# Patient Record
Sex: Male | Born: 1995
Health system: Southern US, Community
[De-identification: ages and names within clinical notes are randomized; demographics above are authoritative.]

---

## 2013-09-23 ENCOUNTER — Emergency Department (HOSPITAL_COMMUNITY)
Admission: EM | Admit: 2013-09-23 | Discharge: 2013-09-23 | Disposition: A | Discharge status: Home / Self Care | Payer: BC Managed Care – PPO | Attending: Emergency Medicine | Admitting: Emergency Medicine

## 2013-09-23 ENCOUNTER — Encounter (HOSPITAL_COMMUNITY): Payer: Self-pay | Admitting: Emergency Medicine

## 2013-09-23 DIAGNOSIS — Y92410 Unspecified street and highway as the place of occurrence of the external cause: Secondary | ICD-10-CM | POA: Insufficient documentation

## 2013-09-23 DIAGNOSIS — S199XXA Unspecified injury of neck, initial encounter: Secondary | ICD-10-CM | POA: Diagnosis present

## 2013-09-23 DIAGNOSIS — S161XXA Strain of muscle, fascia and tendon at neck level, initial encounter: Secondary | ICD-10-CM

## 2013-09-23 DIAGNOSIS — S139XXA Sprain of joints and ligaments of unspecified parts of neck, initial encounter: Secondary | ICD-10-CM | POA: Diagnosis not present

## 2013-09-23 DIAGNOSIS — Y9389 Activity, other specified: Secondary | ICD-10-CM | POA: Insufficient documentation

## 2013-09-23 DIAGNOSIS — S0993XA Unspecified injury of face, initial encounter: Secondary | ICD-10-CM | POA: Diagnosis present

## 2013-09-23 MED ORDER — NAPROXEN 500 MG PO TABS: 500.0000 mg | ORAL_TABLET | Freq: Two times a day (BID) | ORAL | Status: DC

## 2013-09-23 MED ORDER — METHOCARBAMOL 500 MG PO TABS: 500.0000 mg | ORAL_TABLET | Freq: Two times a day (BID) | ORAL | Status: DC

## 2013-09-23 MED ORDER — METHOCARBAMOL 500 MG PO TABS
500.0000 mg | ORAL_TABLET | Freq: Once | ORAL | Status: AC
  Administered 2013-09-23: 500 mg via ORAL
  Filled 2013-09-23: qty 1

## 2013-09-23 MED ORDER — HYDROCODONE-ACETAMINOPHEN 5-325 MG PO TABS: 1.0000 | ORAL_TABLET | ORAL | Status: DC | PRN

## 2013-09-23 MED ORDER — IBUPROFEN 800 MG PO TABS
800.0000 mg | ORAL_TABLET | Freq: Once | ORAL | Status: AC
  Administered 2013-09-23: 800 mg via ORAL
  Filled 2013-09-23: qty 1
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
## 2013-09-23 NOTE — ED Provider Notes (Signed)
Medical screening examination/treatment/procedure(s) were performed by non-physician practitioner and as supervising physician I was immediately available for consultation/collaboration.    Jaquarius Seder M Noely Kuhnle, MD 09/23/13 0629 

## 2013-09-23 NOTE — Discharge Instructions (Signed)
1. Medications: robaxin, naproxyn, vicodin, usual home medications 2. Treatment: rest, drink plenty of fluids, gentle stretching as discussed, alternate ice and heat 3. Follow Up: Please followup with your primary doctor for discussion of your diagnoses and further evaluation after today's visit; if you do not have a primary care doctor use the resource guide provided to find one;   Cervical Sprain A cervical sprain is an injury in the neck in which the strong, fibrous tissues (ligaments) that connect your neck bones stretch or tear. Cervical sprains can range from mild to severe. Severe cervical sprains can cause the neck vertebrae to be unstable. This can lead to damage of the spinal cord and can result in serious nervous system problems. The amount of time it takes for a cervical sprain to get better depends on the cause and extent of the injury. Most cervical sprains heal in 1 to 3 weeks. CAUSES  Severe cervical sprains may be caused by:   Contact sport injuries (such as from football, rugby, wrestling, hockey, auto racing, gymnastics, diving, martial arts, or boxing).   Motor vehicle collisions.   Whiplash injuries. This is an injury from a sudden forward-and backward whipping movement of the head and neck.  Falls.  Mild cervical sprains may be caused by:   Being in an awkward position, such as while cradling a telephone between your ear and shoulder.   Sitting in a chair that does not offer proper support.   Working at a poorly Marketing executive station.   Looking up or down for long periods of time.  SYMPTOMS   Pain, soreness, stiffness, or a burning sensation in the front, back, or sides of the neck. This discomfort may develop immediately after the injury or slowly, 24 hours or more after the injury.   Pain or tenderness directly in the middle of the back of the neck.   Shoulder or upper back pain.   Limited ability to move the neck.   Headache.    Dizziness.   Weakness, numbness, or tingling in the hands or arms.   Muscle spasms.   Difficulty swallowing or chewing.   Tenderness and swelling of the neck.  DIAGNOSIS  Most of the time your health care provider can diagnose a cervical sprain by taking your history and doing a physical exam. Your health care provider will ask about previous neck injuries and any known neck problems, such as arthritis in the neck. X-rays may be taken to find out if there are any other problems, such as with the bones of the neck. Other tests, such as a CT scan or MRI, may also be needed.  TREATMENT  Treatment depends on the severity of the cervical sprain. Mild sprains can be treated with rest, keeping the neck in place (immobilization), and pain medicines. Severe cervical sprains are immediately immobilized. Further treatment is done to help with pain, muscle spasms, and other symptoms and may include:  Medicines, such as pain relievers, numbing medicines, or muscle relaxants.   Physical therapy. This may involve stretching exercises, strengthening exercises, and posture training. Exercises and improved posture can help stabilize the neck, strengthen muscles, and help stop symptoms from returning.  HOME CARE INSTRUCTIONS   Put ice on the injured area.   Put ice in a plastic bag.   Place a towel between your skin and the bag.   Leave the ice on for 15 20 minutes, 3 4 times a day.   If your injury was severe, you may have  been given a cervical collar to wear. A cervical collar is a two-piece collar designed to keep your neck from moving while it heals.  Do not remove the collar unless instructed by your health care provider.  If you have long hair, keep it outside of the collar.  Ask your health care provider before making any adjustments to your collar. Minor adjustments may be required over time to improve comfort and reduce pressure on your chin or on the back of your  head.  Ifyou are allowed to remove the collar for cleaning or bathing, follow your health care provider's instructions on how to do so safely.  Keep your collar clean by wiping it with mild soap and water and drying it completely. If the collar you have been given includes removable pads, remove them every 1 2 days and hand wash them with soap and water. Allow them to air dry. They should be completely dry before you wear them in the collar.  If you are allowed to remove the collar for cleaning and bathing, wash and dry the skin of your neck. Check your skin for irritation or sores. If you see any, tell your health care provider.  Do not drive while wearing the collar.   Only take over-the-counter or prescription medicines for pain, discomfort, or fever as directed by your health care provider.   Keep all follow-up appointments as directed by your health care provider.   Keep all physical therapy appointments as directed by your health care provider.   Make any needed adjustments to your workstation to promote good posture.   Avoid positions and activities that make your symptoms worse.   Warm up and stretch before being active to help prevent problems.  SEEK MEDICAL CARE IF:   Your pain is not controlled with medicine.   You are unable to decrease your pain medicine over time as planned.   Your activity level is not improving as expected.  SEEK IMMEDIATE MEDICAL CARE IF:   You develop any bleeding.  You develop stomach upset.  You have signs of an allergic reaction to your medicine.   Your symptoms get worse.   You develop new, unexplained symptoms.   You have numbness, tingling, weakness, or paralysis in any part of your body.  MAKE SURE YOU:   Understand these instructions.  Will watch your condition.  Will get help right away if you are not doing well or get worse. Document Released: 05/04/2007 Document Revised: 04/27/2013 Document Reviewed:  01/12/2013 [facility] Patient Information 2014 [place], [place].
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
## 2017-01-14 DIAGNOSIS — M25512 Pain in left shoulder: Secondary | ICD-10-CM | POA: Diagnosis not present

## 2017-01-14 DIAGNOSIS — Z6823 Body mass index (BMI) 23.0-23.9, adult: Secondary | ICD-10-CM | POA: Diagnosis not present

## 2017-01-17 ENCOUNTER — Emergency Department (HOSPITAL_COMMUNITY)
Admission: EM | Admit: 2017-01-17 | Discharge: 2017-01-17 | Disposition: A | Discharge status: Home / Self Care | Payer: BLUE CROSS/BLUE SHIELD | Attending: Emergency Medicine | Admitting: Emergency Medicine

## 2017-01-17 ENCOUNTER — Emergency Department (HOSPITAL_COMMUNITY): Payer: BLUE CROSS/BLUE SHIELD

## 2017-01-17 ENCOUNTER — Encounter (HOSPITAL_COMMUNITY): Payer: Self-pay | Admitting: Nurse Practitioner

## 2017-01-17 DIAGNOSIS — S01112A Laceration without foreign body of left eyelid and periocular area, initial encounter: Secondary | ICD-10-CM | POA: Insufficient documentation

## 2017-01-17 DIAGNOSIS — F10920 Alcohol use, unspecified with intoxication, uncomplicated: Secondary | ICD-10-CM

## 2017-01-17 DIAGNOSIS — Y999 Unspecified external cause status: Secondary | ICD-10-CM | POA: Insufficient documentation

## 2017-01-17 DIAGNOSIS — F10120 Alcohol abuse with intoxication, uncomplicated: Secondary | ICD-10-CM | POA: Insufficient documentation

## 2017-01-17 DIAGNOSIS — S0990XA Unspecified injury of head, initial encounter: Secondary | ICD-10-CM | POA: Insufficient documentation

## 2017-01-17 DIAGNOSIS — Z23 Encounter for immunization: Secondary | ICD-10-CM | POA: Insufficient documentation

## 2017-01-17 DIAGNOSIS — Y9389 Activity, other specified: Secondary | ICD-10-CM | POA: Diagnosis not present

## 2017-01-17 DIAGNOSIS — Y9289 Other specified places as the place of occurrence of the external cause: Secondary | ICD-10-CM | POA: Diagnosis not present

## 2017-01-17 DIAGNOSIS — S01311A Laceration without foreign body of right ear, initial encounter: Secondary | ICD-10-CM | POA: Diagnosis not present

## 2017-01-17 DIAGNOSIS — Z79899 Other long term (current) drug therapy: Secondary | ICD-10-CM | POA: Insufficient documentation

## 2017-01-17 MED ORDER — FLUORESCEIN SODIUM 0.6 MG OP STRP
1.0000 | ORAL_STRIP | Freq: Once | OPHTHALMIC | Status: AC
  Administered 2017-01-17: 1 via OPHTHALMIC
  Filled 2017-01-17: qty 1

## 2017-01-17 MED ORDER — LIDOCAINE-EPINEPHRINE (PF) 2 %-1:200000 IJ SOLN
20.0000 mL | Freq: Once | INTRAMUSCULAR | Status: AC
  Administered 2017-01-17: 20 mL
  Filled 2017-01-17: qty 20

## 2017-01-17 MED ORDER — TETRACAINE HCL 0.5 % OP SOLN
2.0000 [drp] | Freq: Once | OPHTHALMIC | Status: AC
  Administered 2017-01-17: 2 [drp] via OPHTHALMIC
  Filled 2017-01-17: qty 4

## 2017-01-17 MED ORDER — TETANUS-DIPHTH-ACELL PERTUSSIS 5-2.5-18.5 LF-MCG/0.5 IM SUSP
0.5000 mL | Freq: Once | INTRAMUSCULAR | Status: AC
  Administered 2017-01-17: 0.5 mL via INTRAMUSCULAR
  Filled 2017-01-17: qty 0.5

## 2017-01-17 NOTE — ED Notes (Signed)
Patient transported to CT 

## 2017-01-17 NOTE — Discharge Instructions (Signed)
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
## 2017-01-25 ENCOUNTER — Ambulatory Visit (HOSPITAL_COMMUNITY)
Admission: EM | Admit: 2017-01-25 | Discharge: 2017-01-25 | Disposition: A | Discharge status: Home / Self Care | Payer: BLUE CROSS/BLUE SHIELD | Attending: Internal Medicine | Admitting: Internal Medicine

## 2017-01-25 ENCOUNTER — Encounter (HOSPITAL_COMMUNITY): Payer: Self-pay | Admitting: Emergency Medicine

## 2017-01-25 DIAGNOSIS — Z4802 Encounter for removal of sutures: Secondary | ICD-10-CM

## 2017-01-25 NOTE — ED Triage Notes (Signed)
[facility] 7 sutures [facility] from his left eyebrow that were placed 8 days ago.

## 2017-01-25 NOTE — Discharge Instructions (Signed)
Recheck for any increasing redness/swelling/pain/drainage or new fever >100.5.  Continue good wound care with washing 1-2x daily with soap/water and applying antibiotic ointment, until scabs resolve.

## 2017-01-25 NOTE — ED Provider Notes (Signed)
  [facility]    CSN: [id number] Arrival date & time: 01/25/17  1858     History   Chief Complaint Chief Complaint  Patient presents with  . Suture / Staple Removal    HPI Marc Parker is a 21 y.o. male. Here for removal of 7 sutures at the left eyebrow. No pain/drainage, no redness. Feels fine.  HPI  History reviewed. No pertinent past medical history.  History reviewed. No pertinent surgical history.     Home Medications   Takes no meds regularly  Family History History reviewed. No pertinent family history.  Social History Social History  Substance Use Topics  . Smoking status: Never Smoker  . Smokeless tobacco: Current User  . Alcohol use Yes     Allergies   Patient has no known allergies.   Review of Systems Review of Systems  All other systems reviewed and are negative.    Physical Exam Triage Vital Signs ED Triage Vitals  Enc Vitals Group     BP 01/25/17 1921 (!) 105/58     Pulse Rate 01/25/17 1921 72     Resp 01/25/17 1921 18     Temp 01/25/17 1921 98.7 F (37.1 C)     Temp Source 01/25/17 1921 Oral     SpO2 01/25/17 1921 99 %     Weight --      Height --      Pain Score 01/25/17 1920 0   Updated Vital Signs BP (!) 105/58 (BP Location: Right Arm)   Pulse 72   Temp 98.7 F (37.1 C) (Oral)   Resp 18   SpO2 99%   Physical Exam  Constitutional: He is oriented to person, place, and time. No distress.  Alert, nicely groomed  HENT:  Head: Atraumatic.  Eyes:  Conjugate gaze, no eye redness/drainage  Neck: Neck supple.  Cardiovascular: Normal rate.   Pulmonary/Chest: No respiratory distress.  Abdominal: He exhibits no distension.  Musculoskeletal: Normal range of motion.  Neurological: He is alert and oriented to person, place, and time.  Skin: Skin is warm and dry.  No cyanosis 7 sutures in the left eyebrow area. Wound edges are well approximated, and healed. There is no erythema/swelling/drainage, the area is  nontender. 7 sutures were removed without incident.  Nursing note and vitals reviewed.    UC Treatments / Results   Procedures Procedures (including critical care time) #7 sutures removed from left eyebrow    Final Clinical Impressions(s) / UC Diagnoses   Final diagnoses:  Visit for suture removal   Recheck as needed.     Eustace MooreMurray, Laura W, MD 01/28/17 2233

## 2017-06-20 ENCOUNTER — Ambulatory Visit (HOSPITAL_COMMUNITY)
Admission: EM | Admit: 2017-06-20 | Discharge: 2017-06-20 | Disposition: A | Discharge status: Home / Self Care | Payer: BLUE CROSS/BLUE SHIELD | Attending: Family Medicine | Admitting: Family Medicine

## 2017-06-20 ENCOUNTER — Encounter (HOSPITAL_COMMUNITY): Payer: Self-pay | Admitting: Family Medicine

## 2017-06-20 DIAGNOSIS — J029 Acute pharyngitis, unspecified: Secondary | ICD-10-CM | POA: Diagnosis not present

## 2017-06-20 LAB — POCT RAPID STREP A: Streptococcus, Group A Screen (Direct): NEGATIVE

## 2017-06-20 MED ORDER — AMOXICILLIN 875 MG PO TABS: 875.0000 mg | ORAL_TABLET | Freq: Two times a day (BID) | ORAL | 0 refills | Status: DC

## 2017-06-20 NOTE — Discharge Instructions (Signed)
The initial throat test was negative, so we will running a more comprehensive test which should be completed Monday.  We will let you know the results when they come back.  In the meantime I want you to start the antibiotic because of the severity of this.

## 2017-06-20 NOTE — ED Provider Notes (Signed)
  [facility]   [id number] 06/20/17 Arrival Time: 1450   SUBJECTIVE:  Marc Parker is a 21 y.o. male who presents to the urgent care with complaint of sore throat x 2 days. Reports drainage and runny nose.   No ear pain or cough  Works for [employer] as Production designer, theatre/television/filmmanager   History reviewed. No pertinent past medical history. History reviewed. No pertinent family history. Social History   Socioeconomic History  . Marital status: Single    Spouse name: Not on file  . Number of children: Not on file  . Years of education: Not on file  . Highest education level: Not on file  Social Needs  . Financial resource strain: Not on file  . Food insecurity - worry: Not on file  . Food insecurity - inability: Not on file  . Transportation needs - medical: Not on file  . Transportation needs - non-medical: Not on file  Occupational History  . Not on file  Tobacco Use  . Smoking status: Never Smoker  . Smokeless tobacco: Current User  Substance and Sexual Activity  . Alcohol use: Yes  . Drug use: No  . Sexual activity: Not on file  Other Topics Concern  . Not on file  Social History Narrative  . Not on file   No outpatient medications have been marked as taking for the 06/20/17 encounter [facility](Hospital Encounter).   No Known Allergies    ROS: As per HPI, remainder of ROS negative.   OBJECTIVE:   Vitals:   06/20/17 1538  BP: 102/63  Pulse: 79  Resp: 18  Temp: 98.5 F (36.9 C)  SpO2: 100%     General appearance: alert; no distress Eyes: PERRL; EOMI; conjunctiva normal HENT: normocephalic; atraumatic; external ears normal without trauma; nasal mucosa normal; oral mucosa very red and edematous posterior pharynx Neck: supple Lungs: clear to auscultation bilaterally Heart: regular rate and rhythm Back: no CVA tenderness Extremities: no cyanosis or edema; symmetrical with no gross deformities Skin: warm and dry Neurologic: normal gait; grossly normal Psychological: alert and  cooperative; normal mood and affect      Labs:  Results for orders placed or performed during the hospital encounter of 06/20/17  POCT rapid strep A [facility])  Result Value Ref Range   Streptococcus, Group A Screen (Direct) NEGATIVE NEGATIVE    Labs Reviewed  CULTURE, GROUP A STREP [facility])  POCT RAPID STREP A    No results found.     ASSESSMENT & PLAN:  1. Sore throat     Meds ordered this encounter  Medications  . amoxicillin (AMOXIL) 875 MG tablet    Sig: Take 1 tablet (875 mg total) by mouth 2 (two) times daily.    Dispense:  20 tablet    Refill:  0    Reviewed expectations re: course of current medical issues. Questions answered. Outlined signs and symptoms indicating need for more acute intervention. Patient verbalized understanding. After Visit Summary given.    Procedures:      Elvina SidleLauenstein, Benoit Meech, MD 06/20/17 [phone]

## 2017-06-20 NOTE — ED Triage Notes (Signed)
Pt here for sore throat x 2 days. Reports drainage and runny nose.

## 2017-06-23 LAB — CULTURE, GROUP A STREP (THRC)

## 2018-02-06 ENCOUNTER — Ambulatory Visit (HOSPITAL_COMMUNITY)
Admission: EM | Admit: 2018-02-06 | Discharge: 2018-02-06 | Disposition: A | Discharge status: Home / Self Care | Payer: BLUE CROSS/BLUE SHIELD | Attending: Family Medicine | Admitting: Family Medicine

## 2018-02-06 ENCOUNTER — Encounter (HOSPITAL_COMMUNITY): Payer: Self-pay

## 2018-02-06 ENCOUNTER — Other Ambulatory Visit: Payer: Self-pay

## 2018-02-06 DIAGNOSIS — J019 Acute sinusitis, unspecified: Secondary | ICD-10-CM

## 2018-02-06 MED ORDER — MONTELUKAST SODIUM 10 MG PO TABS: 10.0000 mg | ORAL_TABLET | Freq: Every day | ORAL | 0 refills | Status: DC

## 2018-02-06 MED ORDER — AZITHROMYCIN 250 MG PO TABS: ORAL_TABLET | ORAL | 0 refills | Status: DC

## 2018-02-06 NOTE — ED Triage Notes (Signed)
[facility], pt complains of nasal drainage and cough. Pt has taken OTC medications but has no relief

## 2018-02-06 NOTE — ED Provider Notes (Signed)
[facility]    CSN: [id number] Arrival date & time: 02/06/18  1503     History   Chief Complaint Chief Complaint  Patient presents with  . Facial Pain    HPI Marc MinsBrandon Parker is a 22 y.o. male.   HPI Sinus infection: C/O runny nose, nasal congestion, greening nasal discharge, postnasal drip and cough ongoing for 1 week. He denies fever, no facial pain. He had been on Zyrtec in the past. Denies sick contact. Symptoms seems to be worsening.  History reviewed. No pertinent past medical history.  There are no active problems to display for this patient.   History reviewed. No pertinent surgical history.     Home Medications    Prior to Admission medications   Medication Sig Start Date End Date Taking? Authorizing Provider  amoxicillin (AMOXIL) 875 MG tablet Take 1 tablet (875 mg total) by mouth 2 (two) times daily. 06/20/17   Elvina SidleLauenstein, Kurt, MD    Family History History reviewed. No pertinent family history.  Social History Social History   Tobacco Use  . Smoking status: Never Smoker  . Smokeless tobacco: Current User  Substance Use Topics  . Alcohol use: Yes  . Drug use: No     Allergies   Patient has no known allergies.   Review of Systems Review of Systems  Constitutional: Negative.  Negative for fever.  HENT: Positive for congestion, postnasal drip, rhinorrhea, sneezing and voice change. Negative for facial swelling, sinus pressure, sinus pain and trouble swallowing.   Respiratory: Positive for cough. Negative for wheezing.   Cardiovascular: Negative.   Gastrointestinal: Negative.   Musculoskeletal: Negative.   All other systems reviewed and are negative.    Physical Exam Triage Vital Signs ED Triage Vitals  Enc Vitals Group     BP 02/06/18 1528 123/72     Pulse Rate 02/06/18 1528 72     Resp --      Temp 02/06/18 1528 98.5 F (36.9 C)     Temp Source 02/06/18 1528 Oral     SpO2 02/06/18 1528 99 %     Weight --      Height --        Head Circumference --      Peak Flow --      Pain Score 02/06/18 1529 0     Pain Loc --      Pain Edu? --      Excl. in GC? --    No data found.  Updated Vital Signs BP 123/72 (BP Location: Left Arm)   Pulse 72   Temp 98.5 F (36.9 C) (Oral)   SpO2 99%   Visual Acuity Right Eye Distance:   Left Eye Distance:   Bilateral Distance:    Right Eye Near:   Left Eye Near:    Bilateral Near:     Physical Exam  Constitutional: No distress.  HENT:  Right Ear: External ear and ear canal normal. No drainage or swelling. Tympanic membrane is not injected and not perforated. No middle ear effusion. No hemotympanum.  Left Ear: External ear and ear canal normal. No drainage or swelling. Tympanic membrane is injected. Tympanic membrane is not perforated.  No middle ear effusion. No hemotympanum.  Nose: Nose normal.  Mouth/Throat: Oropharynx is clear and moist. No oropharyngeal exudate.  Eyes: Pupils are equal, round, and reactive to light. EOM are normal. Right eye exhibits no discharge. Left eye exhibits no discharge.  Neck: Neck supple.  Cardiovascular: Normal rate, regular  rhythm and normal heart sounds.  No murmur heard. Pulmonary/Chest: Effort normal and breath sounds normal. No stridor. No respiratory distress. He has no wheezes.  Abdominal: Bowel sounds are normal. He exhibits no distension. There is no tenderness.     UC Treatments / Results  Labs (all labs ordered are listed, but only abnormal results are displayed) Labs Reviewed - No data to display  EKG None  Radiology No results found.  Procedures Procedures (including critical care time)  Medications Ordered in UC Medications - No data to display  Initial Impression / Assessment and Plan / UC Course  I have reviewed the triage vital signs and the nursing notes.  Pertinent labs & imaging results that were available during my care of the patient were reviewed by me and considered in my medical decision  making (see chart for details).  Clinical Course as of Feb 06 1609  Sat Feb 06, 2018  1607 Acute sinusitis. Likely allergic. No signs of infection. Starts Singulair. Nasal saline rinse recommended. May restart Zyrtec as well. ZPak prescribed to start only if symptoms persists or worsens. He agreed with the plan. F/U as needed.   [KE]    Clinical Course User Index [KE] Doreene Eland, MD    Acute non-recurrent sinusitis, unspecified location   Final Clinical Impressions(s) / UC Diagnoses   Final diagnoses:  None   Discharge Instructions   None    ED Prescriptions    None     Controlled Substance Prescriptions [place] Controlled Substance Registry consulted? Not Applicable   Doreene Eland, MD 02/06/18 [phone]

## 2018-02-06 NOTE — Discharge Instructions (Signed)
It was nice seeing you. I am sorry you don't feel well. You likely have allergic sinusitis. Please use Singuliar daily. May also restart Zyrtec. I prescribed antibiotic, only start if symptoms worsens or not getting better in about 5-7 days. See us soon if no improvement.

## 2018-03-03 ENCOUNTER — Other Ambulatory Visit: Payer: Self-pay | Admitting: Family Medicine

## 2018-03-29 ENCOUNTER — Other Ambulatory Visit: Payer: Self-pay | Admitting: Family Medicine

## 2018-04-18 ENCOUNTER — Encounter (HOSPITAL_COMMUNITY): Payer: Self-pay | Admitting: Emergency Medicine

## 2018-04-18 ENCOUNTER — Ambulatory Visit (HOSPITAL_COMMUNITY)
Admission: EM | Admit: 2018-04-18 | Discharge: 2018-04-18 | Disposition: A | Discharge status: Home / Self Care | Payer: BLUE CROSS/BLUE SHIELD | Attending: Urgent Care | Admitting: Urgent Care

## 2018-04-18 DIAGNOSIS — W57XXXA Bitten or stung by nonvenomous insect and other nonvenomous arthropods, initial encounter: Secondary | ICD-10-CM | POA: Diagnosis not present

## 2018-04-18 DIAGNOSIS — S40862A Insect bite (nonvenomous) of left upper arm, initial encounter: Secondary | ICD-10-CM | POA: Diagnosis not present

## 2018-04-18 DIAGNOSIS — L299 Pruritus, unspecified: Secondary | ICD-10-CM

## 2018-04-18 MED ORDER — CETIRIZINE HCL 10 MG PO TABS: 10.0000 mg | ORAL_TABLET | Freq: Every day | ORAL | 0 refills | Status: AC

## 2018-04-18 MED ORDER — RANITIDINE HCL 150 MG PO TABS: 150.0000 mg | ORAL_TABLET | Freq: Two times a day (BID) | ORAL | 0 refills | Status: AC

## 2018-04-18 MED ORDER — MONTELUKAST SODIUM 10 MG PO TABS: 10.0000 mg | ORAL_TABLET | Freq: Every day | ORAL | 0 refills | Status: AC

## 2018-04-18 MED ORDER — TRIAMCINOLONE ACETONIDE 0.1 % EX CREA: 1.0000 "application " | TOPICAL_CREAM | Freq: Two times a day (BID) | CUTANEOUS | 0 refills | Status: AC

## 2018-04-18 NOTE — ED Triage Notes (Signed)
Pt c/o spider bite on L upper arm he noticed on Friday, states the redness is spreading.

## 2018-04-18 NOTE — ED Provider Notes (Signed)
  MRN: [id number] DOB: [birth year]  Subjective:   Marc Parker is a 22 y.o. male presenting for a history of spider bite.  Has a red and itchy wound over medial portion of his left upper arm.  Denies fever, nausea, vomiting, drainage of pus or bleeding.  No current facility-administered medications for this encounter.   Current Outpatient Medications:  .  montelukast (SINGULAIR) 10 MG tablet, Take 1 tablet (10 mg total) by mouth at bedtime., Disp: 30 tablet, Rfl: 0   No Known Allergies  Has pmh of seasonal allergies. Denies past surgical history.   Objective:   Vitals: BP 129/73   Pulse 71   Temp 98.3 F (36.8 C)   Resp 16   SpO2 99%   Physical Exam  Constitutional: He is oriented to person, place, and time. He appears well-developed and well-nourished.  Cardiovascular: Normal rate.  Pulmonary/Chest: Effort normal.  Neurological: He is alert and oriented to person, place, and time.  Skin:       Assessment and Plan :   Insect bite of left upper arm, initial encounter  Itching  We will manage as a contact dermatitis with Zyrtec and Zantac, use triamcinolone topically. Return-to-clinic precautions discussed, patient verbalized understanding.      Wallis Bamberg, PA-C 04/18/18 1745

## 2018-04-30 DIAGNOSIS — Z6824 Body mass index (BMI) 24.0-24.9, adult: Secondary | ICD-10-CM | POA: Diagnosis not present

## 2018-04-30 DIAGNOSIS — R21 Rash and other nonspecific skin eruption: Secondary | ICD-10-CM | POA: Diagnosis not present
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
## 2020-03-06 DIAGNOSIS — R509 Fever, unspecified: Secondary | ICD-10-CM | POA: Diagnosis not present

## 2020-03-06 DIAGNOSIS — R070 Pain in throat: Secondary | ICD-10-CM | POA: Diagnosis not present

## 2020-03-20 DIAGNOSIS — M222X1 Patellofemoral disorders, right knee: Secondary | ICD-10-CM | POA: Diagnosis not present

## 2020-03-20 DIAGNOSIS — R1084 Generalized abdominal pain: Secondary | ICD-10-CM | POA: Diagnosis not present

## 2020-05-25 DIAGNOSIS — Z0000 Encounter for general adult medical examination without abnormal findings: Secondary | ICD-10-CM | POA: Diagnosis not present

## 2020-05-25 DIAGNOSIS — K219 Gastro-esophageal reflux disease without esophagitis: Secondary | ICD-10-CM | POA: Diagnosis not present

## 2020-06-26 DIAGNOSIS — M222X2 Patellofemoral disorders, left knee: Secondary | ICD-10-CM | POA: Diagnosis not present

## 2020-06-26 DIAGNOSIS — S76312A Strain of muscle, fascia and tendon of the posterior muscle group at thigh level, left thigh, initial encounter: Secondary | ICD-10-CM | POA: Diagnosis not present

## 2020-06-26 DIAGNOSIS — M222X1 Patellofemoral disorders, right knee: Secondary | ICD-10-CM | POA: Diagnosis not present
